# Patient Record
Sex: Female | Born: 1960 | Race: White | Hispanic: No | Marital: Married | Smoking: Never smoker
Health system: Southern US, Community
[De-identification: ages and names within clinical notes are randomized; demographics above are authoritative.]

## PROBLEM LIST (undated history)

## (undated) DIAGNOSIS — Z79890 Hormone replacement therapy (postmenopausal): Secondary | ICD-10-CM

## (undated) HISTORY — PX: DILATION AND CURETTAGE OF UTERUS: SHX78

## (undated) HISTORY — DX: Hormone replacement therapy: Z79.890

---

## 1984-07-26 HISTORY — PX: AUGMENTATION MAMMAPLASTY: SUR837

## 1998-10-31 ENCOUNTER — Other Ambulatory Visit: Admission: RE | Admit: 1998-10-31 | Discharge: 1998-10-31 | Payer: Self-pay | Admitting: Obstetrics and Gynecology

## 2002-11-22 ENCOUNTER — Other Ambulatory Visit: Admission: RE | Admit: 2002-11-22 | Discharge: 2002-11-22 | Payer: Self-pay | Admitting: *Deleted

## 2005-01-18 ENCOUNTER — Other Ambulatory Visit: Admission: RE | Admit: 2005-01-18 | Discharge: 2005-01-18 | Payer: Self-pay | Admitting: *Deleted

## 2005-01-19 ENCOUNTER — Encounter: Admission: RE | Admit: 2005-01-19 | Discharge: 2005-01-19 | Payer: Self-pay | Admitting: *Deleted

## 2006-01-20 ENCOUNTER — Other Ambulatory Visit: Admission: RE | Admit: 2006-01-20 | Discharge: 2006-01-20 | Payer: Self-pay | Admitting: Obstetrics & Gynecology

## 2008-08-14 ENCOUNTER — Other Ambulatory Visit: Admission: RE | Admit: 2008-08-14 | Discharge: 2008-08-14 | Payer: Self-pay | Admitting: Obstetrics & Gynecology

## 2009-08-20 ENCOUNTER — Encounter: Admission: RE | Admit: 2009-08-20 | Discharge: 2009-08-20 | Payer: Self-pay | Admitting: Obstetrics & Gynecology

## 2012-11-08 ENCOUNTER — Encounter: Payer: Self-pay | Admitting: *Deleted

## 2012-11-09 ENCOUNTER — Ambulatory Visit (INDEPENDENT_AMBULATORY_CARE_PROVIDER_SITE_OTHER): Payer: BC Managed Care – PPO | Admitting: Obstetrics & Gynecology

## 2012-11-09 ENCOUNTER — Encounter: Payer: Self-pay | Admitting: Obstetrics & Gynecology

## 2012-11-09 VITALS — BP 114/60 | HR 66 | Ht 68.0 in | Wt 129.0 lb

## 2012-11-09 DIAGNOSIS — Z01419 Encounter for gynecological examination (general) (routine) without abnormal findings: Secondary | ICD-10-CM

## 2012-11-09 MED ORDER — MEDROXYPROGESTERONE ACETATE 5 MG PO TABS: 5.0000 mg | ORAL_TABLET | Freq: Every day | ORAL | Status: DC

## 2012-11-09 MED ORDER — ESTRADIOL 0.075 MG/24HR TD PTTW: 1.0000 | MEDICATED_PATCH | TRANSDERMAL | Status: DC

## 2012-11-09 NOTE — Progress Notes (Signed)
52 y.o. [id number] MarriedCaucasianF here for annual exam.  Since going on HRT, is having a monthly cycle.  Some months she can bleed for up to 7 days.  Uses panty liner.  Does have some cramping before cycle.  Also notes breast tenderness for four to five days right before starting cycle.  No hot flashes or night sweats.  Sleep is good.   No LMP recorded. Patient is not currently having periods (Reason: Perimenopausal).           Sexually active: yes   The current method of family planning is post menopausal status.    Exercising: walking, running and yoga 5x weekly Smoker:  No  Health Maintenance: Pap:  10/20/2011  Negative with HR HPV  MMG: 08/20/2009  Neg Colonoscopy:  Will do this year  BMD:   never  TDaP:  2009  Labs: 2012  History reviewed. No pertinent past medical history.  Past Surgical History  Procedure Laterality Date  . Dilation and curettage of uterus    . Augmentation mammaplasty Bilateral 1986    Current Outpatient Prescriptions  Medication Sig Dispense Refill  . Calcium Carbonate-Vitamin D (CALCIUM 600 + D PO) Take by mouth 2 (two) times daily.      . Fish Oil-Cholecalciferol (FISH OIL + D3) 1200-1000 MG-UNIT CAPS Take by mouth daily.      . Multiple Vitamins-Minerals (MULTIVITAMIN PO) Take by mouth daily.       No current facility-administered medications for this visit.   No family history on file.  ROS:  Pertinent items are noted in HPI.  Otherwise, a comprehensive ROS was negative.  Exam:   BP 114/60  Pulse 66  Ht 5\' 8"  (1.727 m)  Wt 129 lb (58.514 kg)  BMI 19.62 kg/m2  Height:   Height: 5\' 8"  (172.7 cm)  Ht Readings from Last 3 Encounters:  11/09/12 5\' 8"  (1.727 m)    General appearance: alert, cooperative and appears stated age Head: Normocephalic, without obvious abnormality, atraumatic Neck: no adenopathy, supple, symmetrical, trachea midline and thyroid normal to inspection and palpation Lungs: clear to auscultation bilaterally Breasts: normal  appearance, no masses or tenderness, bilateral implants present Heart: regular rate and rhythm Abdomen: soft, non-tender; bowel sounds normal; no masses,  no organomegaly Extremities: extremities normal, atraumatic, no cyanosis or edema Skin: Skin color, texture, turgor normal. No rashes or lesions Lymph nodes: Cervical, supraclavicular, and axillary nodes normal. No abnormal inguinal nodes palpated Neurologic: Grossly normal   Pelvic: External genitalia:  no lesions              Urethra:  normal appearing urethra with no masses, tenderness or lesions              Bartholins and Skenes: normal                 Vagina: normal appearing vagina with normal color and discharge, no lesions              Cervix: no lesions              Pap taken: no Bimanual Exam:  Uterus:  normal size, contour, position, consistency, mobility, non-tender              Adnexa: no mass, fullness, tenderness               Rectovaginal: Confirms               Anus:  normal sphincter tone, no lesions  A:  Well Woman with normal exam PMP on HRT  P:   Mammogram.  Pt will do this year.   pap smear last year with neg HR HPV Labs 2012 Continue Minivelle 0.075mg  twice weekly.  Will change Provera to 5mg  qd for 15 days each month.  She will call and give me an update on symptoms. return annually or prn  An After Visit Summary was printed and given to the patient.

## 2012-11-09 NOTE — Patient Instructions (Signed)

## 2012-11-29 ENCOUNTER — Encounter: Payer: Self-pay | Admitting: Obstetrics & Gynecology

## 2013-01-19 ENCOUNTER — Telehealth: Payer: Self-pay | Admitting: Obstetrics & Gynecology

## 2013-01-19 NOTE — Telephone Encounter (Signed)
S/w pt she says she does not have any rf's on her minivelle .75mg , I called [facility] pharmacy. They have no rf's remaining I called in rf's x 1 year. Vivelle Dot .75mg  was sent through 11/09/12 instead of minivelle, Pt is aware.

## 2013-01-19 NOTE — Telephone Encounter (Signed)
Message copied by Elisha Headland on Fri Jan 19, 2013  4:56 PM ------      Message from: Jerene Bears      Created: Thu Jan 18, 2013  9:26 AM      Regarding: needs MMG       Tresa Endo,      Can you please call patient and make a chart note reminding her to get MMG.  Thanks. ------

## 2013-01-19 NOTE — Telephone Encounter (Signed)
Patient's Rx for Mini Velle has not been called in.  [facility]  [address]    needs by Monday

## 2013-01-19 NOTE — Telephone Encounter (Signed)
6/27 [email or web address]//kn 

## 2013-01-23 ENCOUNTER — Telehealth: Payer: Self-pay

## 2013-01-23 NOTE — Telephone Encounter (Signed)
Message copied by Elisha Headland on Tue Jan 23, 2013  4:26 PM ------      Message from: Jerene Bears      Created: Thu Jan 18, 2013  9:26 AM      Regarding: needs MMG       Tresa Endo,      Can you please call patient and make a chart note reminding her to get MMG.  Thanks. ------

## 2013-01-23 NOTE — Telephone Encounter (Signed)
7/1 lmtcb//kn

## 2013-01-24 ENCOUNTER — Other Ambulatory Visit: Payer: Self-pay

## 2013-01-24 DIAGNOSIS — Z1231 Encounter for screening mammogram for malignant neoplasm of breast: Secondary | ICD-10-CM

## 2013-01-24 DIAGNOSIS — Z9882 Breast implant status: Secondary | ICD-10-CM

## 2013-01-29 NOTE — Telephone Encounter (Signed)
Spoke with patient. She is aware that we are waiting for her MMG report. Patient will call this week to get that scheduled.

## 2013-02-13 ENCOUNTER — Ambulatory Visit
Admission: RE | Admit: 2013-02-13 | Discharge: 2013-02-13 | Disposition: A | Discharge status: Home / Self Care | Payer: BC Managed Care – PPO | Source: Ambulatory Visit

## 2013-02-13 DIAGNOSIS — Z9882 Breast implant status: Secondary | ICD-10-CM

## 2013-02-13 DIAGNOSIS — Z1231 Encounter for screening mammogram for malignant neoplasm of breast: Secondary | ICD-10-CM

## 2013-05-31 ENCOUNTER — Other Ambulatory Visit: Payer: Self-pay

## 2013-11-20 ENCOUNTER — Encounter: Payer: Self-pay | Admitting: Obstetrics & Gynecology

## 2013-11-20 ENCOUNTER — Ambulatory Visit (INDEPENDENT_AMBULATORY_CARE_PROVIDER_SITE_OTHER): Payer: BC Managed Care – PPO | Admitting: Obstetrics & Gynecology

## 2013-11-20 VITALS — BP 104/64 | HR 68 | Resp 16 | Ht 67.0 in | Wt 120.2 lb

## 2013-11-20 DIAGNOSIS — Z01419 Encounter for gynecological examination (general) (routine) without abnormal findings: Secondary | ICD-10-CM

## 2013-11-20 DIAGNOSIS — Z124 Encounter for screening for malignant neoplasm of cervix: Secondary | ICD-10-CM

## 2013-11-20 MED ORDER — ESTRADIOL 0.075 MG/24HR TD PTTW: 1.0000 | MEDICATED_PATCH | TRANSDERMAL | Status: DC

## 2013-11-20 MED ORDER — MEDROXYPROGESTERONE ACETATE 5 MG PO TABS: 5.0000 mg | ORAL_TABLET | Freq: Every day | ORAL | Status: DC

## 2013-11-20 NOTE — Progress Notes (Signed)
53 y.o. [id number] MarriedCaucasianF here for annual exam.  Has had blood work.  Cholesterol is good.  Using Vivelle dot.  About 3-5 days into the progesterone she starts spotting.  Spots until it is done.  Flow in March month was quite heavy but then April's bleeding was very light and short.     Patient's last menstrual period was 11/23/2005.          Sexually active: yes  The current method of family planning is post menopausal status.    Exercising: yes  running, yoga, weights Smoker:  no  Health Maintenance: Pap:  10/20/11 WNL/negative HR HPV History of abnormal Pap:  no MMG:  02/13/13 3D-normal Colonoscopy:  None.  Knows is due.  Reports she will schedule.  BMD:   none TDaP:  2009 Screening Labs: PCP, Hb today: PCP, Urine today: PCP   reports that she has never smoked. She has never used smokeless tobacco. She reports that she drinks about 1.5 - 2 ounces of alcohol per week. She reports that she does not use illicit drugs.  History reviewed. No pertinent past medical history.  Past Surgical History  Procedure Laterality Date  . Dilation and curettage of uterus    . Augmentation mammaplasty Bilateral 1986    Current Outpatient Prescriptions  Medication Sig Dispense Refill  . calcium carbonate (TUMS EX) 750 MG chewable tablet Chew 1 tablet by mouth daily.      Marland Kitchen. estradiol (VIVELLE-DOT) 0.075 MG/24HR Place 1 patch onto the skin 2 (two) times a week.  24 patch  4  . medroxyPROGESTERone (PROVERA) 5 MG tablet Take 1 tablet (5 mg total) by mouth daily.  90 tablet  4  . Fish Oil-Cholecalciferol (FISH OIL + D3) 1200-1000 MG-UNIT CAPS Take by mouth daily.       No current facility-administered medications for this visit.    Family History  Problem Relation Age of Onset  . Colon cancer Maternal Uncle 70  . Colon cancer Paternal Grandfather 3970  . Lymphoma Mother 8586    ROS:  Pertinent items are noted in HPI.  Otherwise, a comprehensive ROS was negative.  Exam:   BP 104/64  Pulse  68  Resp 16  Ht 5\' 7"  (1.702 m)  Wt 120 lb 3.2 oz (54.522 kg)  BMI 18.82 kg/m2  LMP 11/23/2005  Weight change: -9#  Height: 5\' 7"  (170.2 cm)  Ht Readings from Last 3 Encounters:  11/20/13 5\' 7"  (1.702 m)  11/09/12 5\' 8"  (1.727 m)    General appearance: alert, cooperative and appears stated age Head: Normocephalic, without obvious abnormality, atraumatic Neck: no adenopathy, supple, symmetrical, trachea midline and thyroid normal to inspection and palpation Lungs: clear to auscultation bilaterally Breasts: normal appearance, no masses or tenderness Heart: regular rate and rhythm Abdomen: soft, non-tender; bowel sounds normal; no masses,  no organomegaly Extremities: extremities normal, atraumatic, no cyanosis or edema Skin: Skin color, texture, turgor normal. No rashes or lesions Lymph nodes: Cervical, supraclavicular, and axillary nodes normal. No abnormal inguinal nodes palpated Neurologic: Grossly normal   Pelvic: External genitalia:  no lesions              Urethra:  normal appearing urethra with no masses, tenderness or lesions              Bartholins and Skenes: normal                 Vagina: normal appearing vagina with normal color and discharge, no lesions  Cervix: no lesions              Pap taken: yes Bimanual Exam:  Uterus:  normal size, contour, position, consistency, mobility, non-tender              Adnexa: normal adnexa and no mass, fullness, tenderness               Rectovaginal: Confirms               Anus:  normal sphincter tone, no lesions  A:  Well Woman with normal exam On HRT Cycling on HRT.  Cycle in march was heavy.  Pt aware she knows I want her to call if has another cycle like March.  P:   Mammogram yearly.  D/W pt 3D due to breast density. pap smear neg with neg HR HPV 3/13.   Labs with husband. Vivelle dot 0.075mg  twice weekly.  Provera 5mg  daily 1-15 weekly. return annually or prn  An After Visit Summary was printed and given to  the patient.

## 2013-11-29 ENCOUNTER — Other Ambulatory Visit: Payer: Self-pay | Admitting: Obstetrics & Gynecology

## 2013-11-29 NOTE — Telephone Encounter (Signed)
[facility]OVERA Last filled - 11/20/13 X 1 YEAR Last AEX - 11/20/13 RX denied as this was given x 1 year at AEX.

## 2014-05-27 ENCOUNTER — Encounter: Payer: Self-pay | Admitting: Obstetrics & Gynecology

## 2014-06-07 ENCOUNTER — Telehealth: Payer: Self-pay | Admitting: Obstetrics & Gynecology

## 2014-06-07 DIAGNOSIS — T8543XA Leakage of breast prosthesis and implant, initial encounter: Secondary | ICD-10-CM

## 2014-06-07 NOTE — Telephone Encounter (Signed)
After review of ROI, left detailed message with pt regarding MMG done 05/29/14 showing a possible small silicone leak from breast implant vs herniation of implant.  Recommended follow up with breast surgeon.  Asked pt to call back so I know she received message and to see if she had any other questions.    Will send this message to Triage to be aware pt may call.

## 2014-06-10 NOTE — Telephone Encounter (Signed)
Patient returning call to Dr Hyacinth MeekerMiller. She states [place] and she will need referral to new surgeon. She had the type of surgery where implant was placed behind pectoral muscle so she would like someone that does this type of work. Has multiple questions about what type of problem this could be and the necessity of surgical correction. Advised that most of these answers will need to come from breast surgeon directly.  Will check with Dr Hyacinth MeekerMiller on who to refer patient to and call her back.

## 2014-06-11 NOTE — Telephone Encounter (Signed)
Spoke with pt personally.  Questions answered.  Will refer pt to Dr. Odis LusterBowers for consultation.  Pt can go anytime for referral.  Order placed.   Martie LeeSabrina, please confirm with Dr. Odis LusterBowers office if pt needs to go and get a copy of her films from [facility] to take to appt.  Also, please send me back a message so I know when appt will be.  Thanks.  CC:  Billie RuddySally Yeakley

## 2014-06-12 NOTE — Telephone Encounter (Signed)
Left message for Office Manager, Thornton Parkngie Collins, to call me back. She schedules all new patient referrals.

## 2014-06-12 NOTE — Telephone Encounter (Signed)
Spoke with Angie from Dr Odis LusterBowers office. Patient is scheduled for consult 12.04 @ 0945. Patient is to bring written report only from [facility] and $150 consultation fee. ** Call to patient. Advised of appt with Dr Odis LusterBowers office, the fact that she should bring written report from [facility], and $150 consultation fee. Patient agreeable.

## 2014-06-14 ENCOUNTER — Telehealth: Payer: Self-pay | Admitting: Obstetrics & Gynecology

## 2014-06-14 NOTE — Telephone Encounter (Signed)
Spoke with patient. Patient states that she spoke with Martie LeeSabrina in our office regarding appointment with Dr.Bowers. "She told me that I needed to bring a report from [facility] to my appointment. My husband thinks that I need to bring the films to the appointment. I was just calling to get clarification on what I need to bring." Advised that I called over to Dr.Bowers office but their office is closed for the day. Will call Monday morning for clarification and return call to patient. Patient is agreeable.

## 2014-06-14 NOTE — Telephone Encounter (Signed)
Pt is calling because she is confused about what she should be taking to her referral appointment.

## 2014-06-18 NOTE — Telephone Encounter (Signed)
Spoke with Dr.Bowers office who states that mammogram report will be needed for office visit on 12/4/20115 with Dr.Bowers. Faxed mammogram report to Dr.Bowers office at [phone] with cover sheet and fax confirmation. Spoke with patient [facility] and that I have faxed report. Patient is agreeable and verbalizes understanding.  Routing to provider for final review. Patient agreeable to disposition. Will close encounter

## 2014-07-09 HISTORY — PX: BREAST SURGERY: SHX581

## 2014-10-31 IMAGING — MG MM DIGITAL SCREENING IMPLANTS W/ CAD
12 series · 12 of 12 positions shown · non-contrast
Comparison: 08/20/2009

CLINICAL DATA: Screening.

[L CC (1 of 2)]
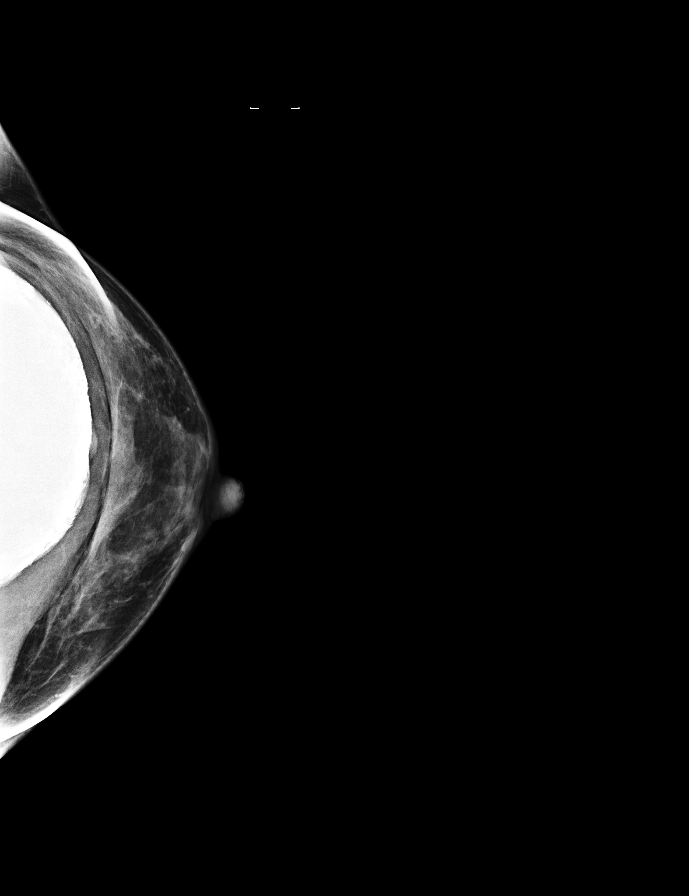

[L MLO (1 of 2)]
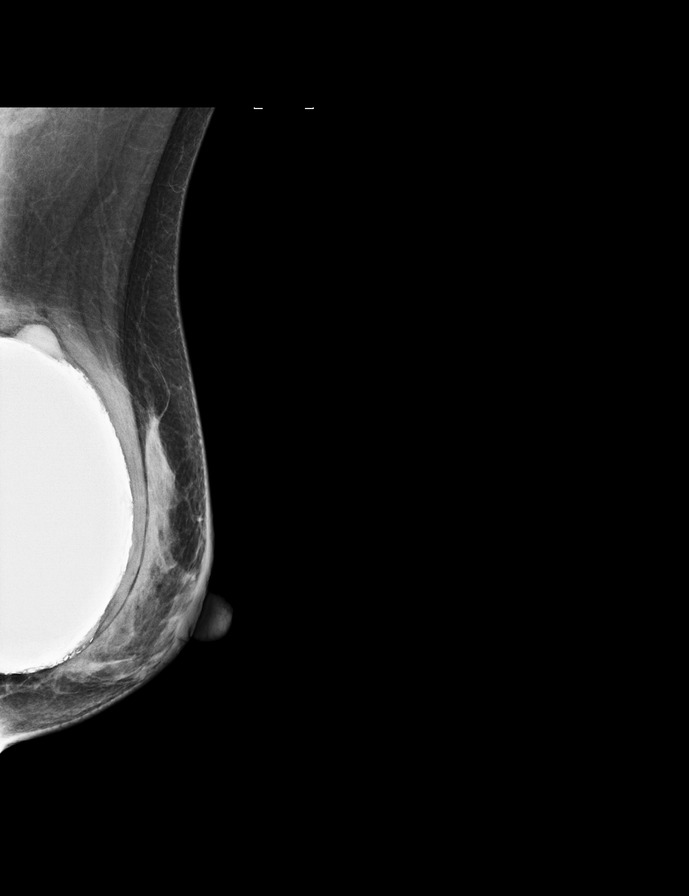

[R MLO (1 of 2)]
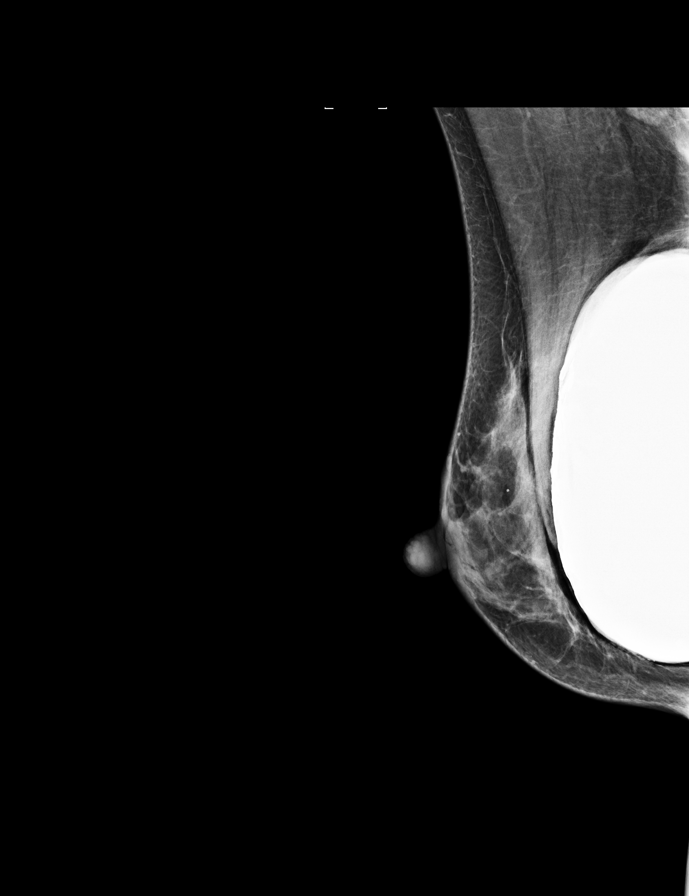

[R CC (1 of 2)]
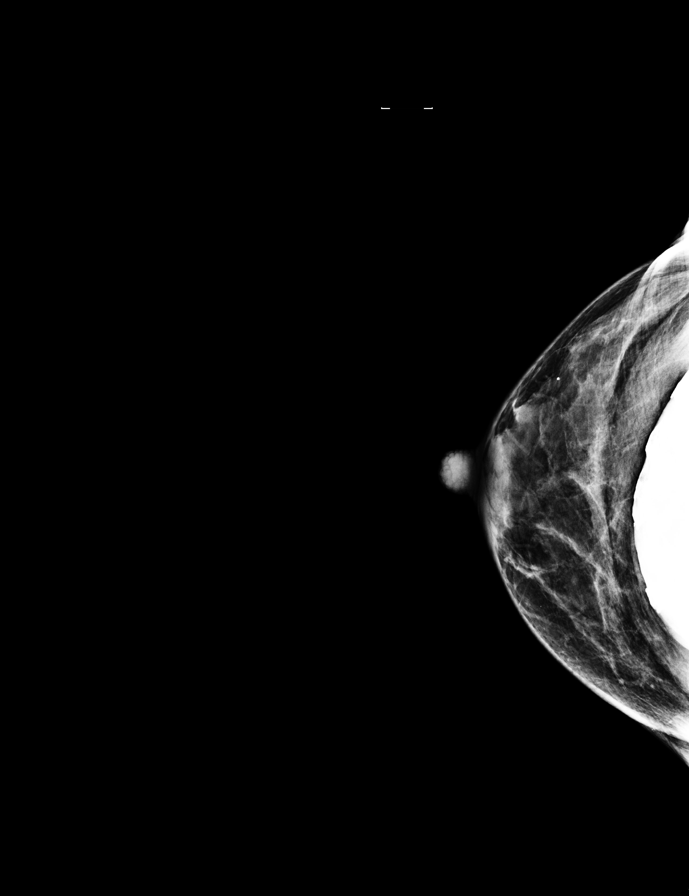

[R CC (2 of 2)]
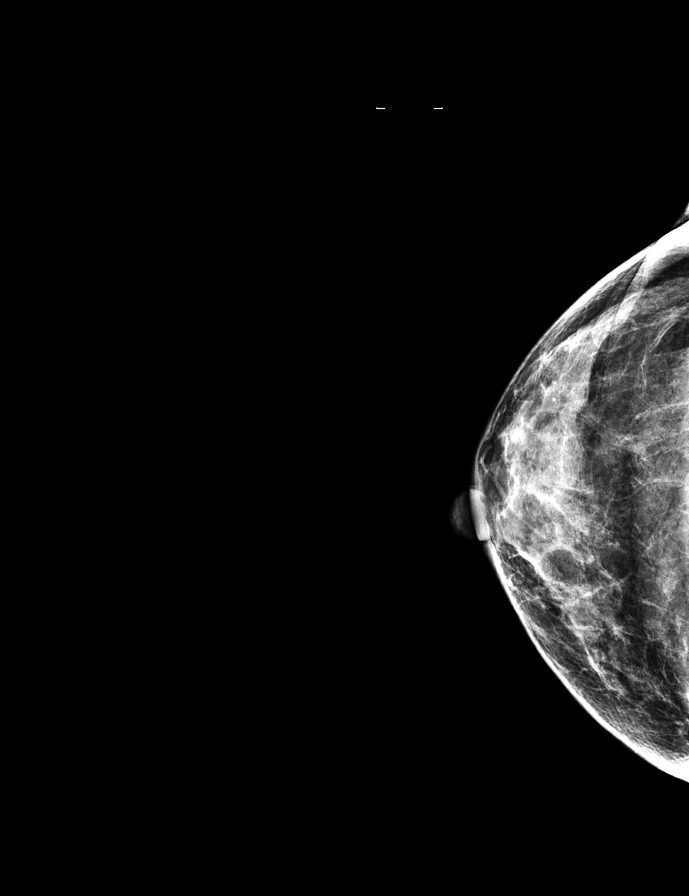

[L CC (2 of 2)]
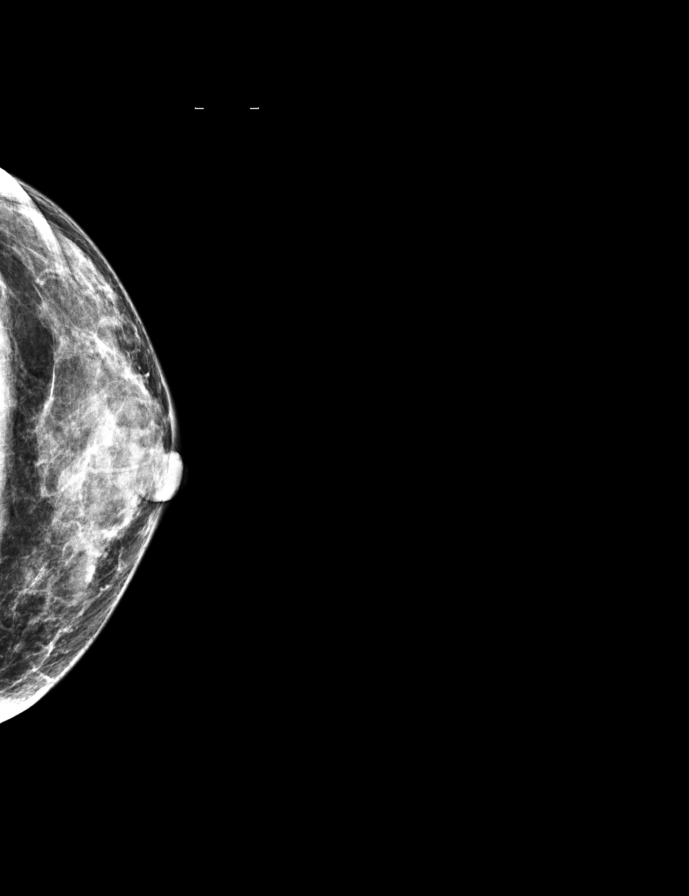

[R MLO (2 of 2)]
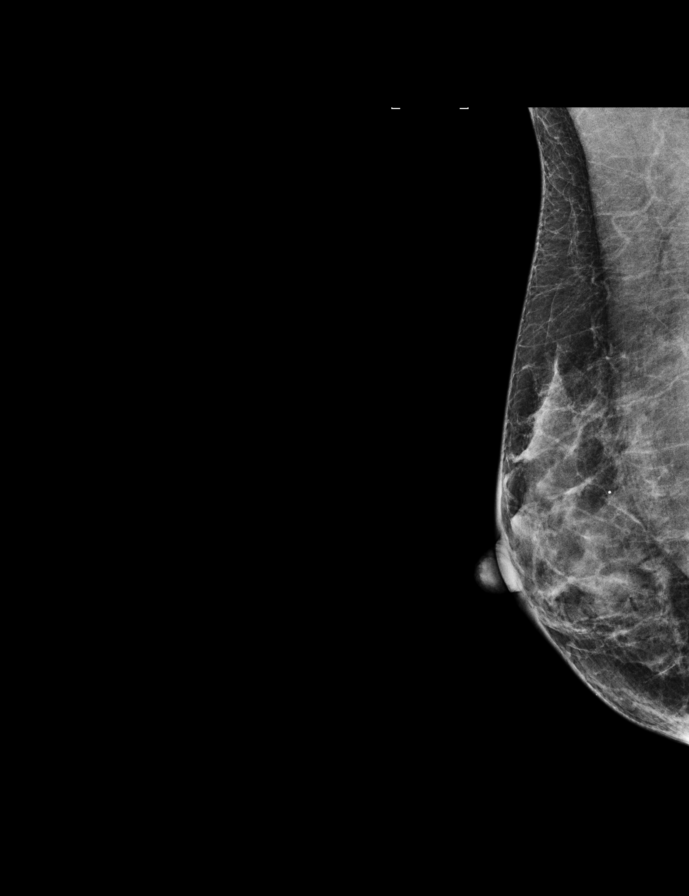

[L MLO (2 of 2)]
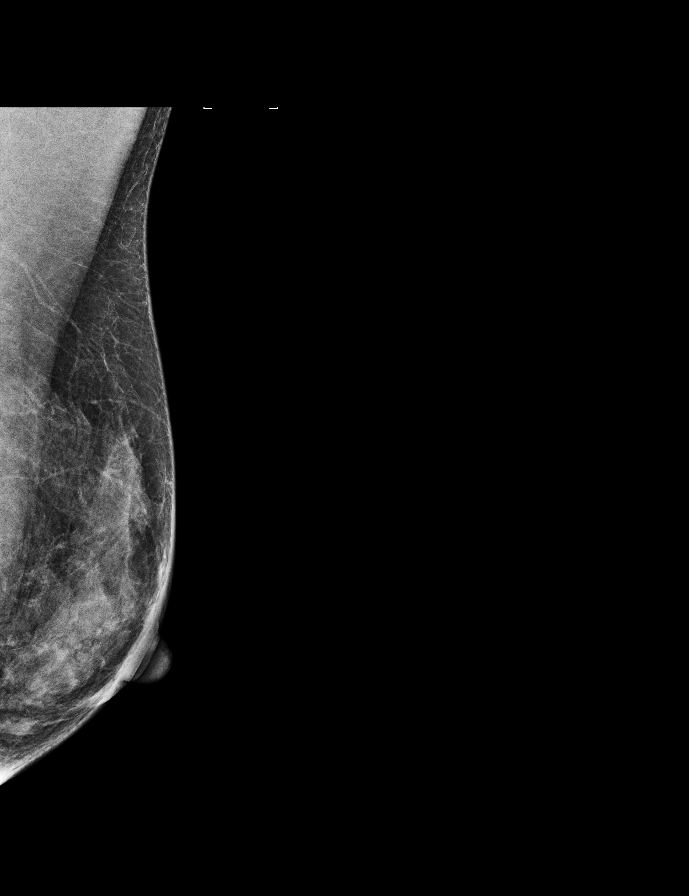

[L CC tomo]
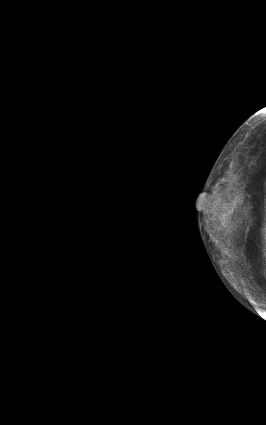

[L MLO tomo]
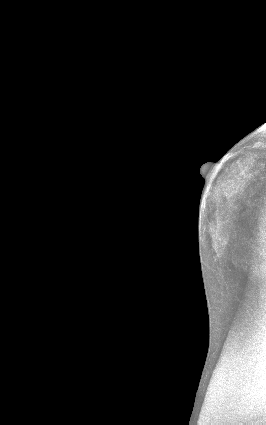

[R CC tomo]
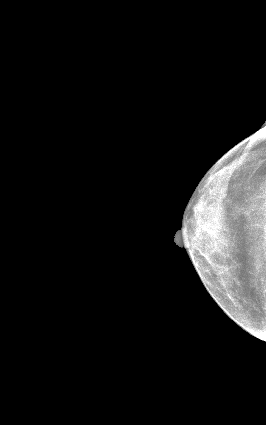

[R MLO tomo]
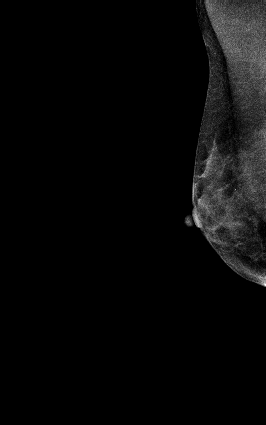

[12 of 12 positions shown; findings below may reference images not displayed]

DIGITAL SCREENING BILATERAL MAMMOGRAM WITH IMPLANTS AND CAD
DIGITAL BREAST TOMOSYNTHESIS

Digital breast tomosynthesis images are acquired in two
projections.  These images are reviewed in combination with the
digital mammogram, confirming the findings below.
The patient has subpectoral silicone implants. Standard and implant
displaced views were performed.
FINDINGS: ACR Breast Density Category c:  The breast tissue is
heterogeneously dense, which may obscure small masses.

There is no suspicious dominant mass, architectural distortion, or
calcification to suggest malignancy.

Images were processed with CAD.
IMPRESSION: No mammographic evidence of malignancy.

A result letter of this screening mammogram will be mailed directly
to the patient.

RECOMMENDATION:
Screening mammogram in one year. (Code:DC-S-IYP)

BI-RADS CATEGORY 1:  Negative.

## 2014-11-26 ENCOUNTER — Ambulatory Visit (INDEPENDENT_AMBULATORY_CARE_PROVIDER_SITE_OTHER): Payer: BLUE CROSS/BLUE SHIELD | Admitting: Obstetrics & Gynecology

## 2014-11-26 ENCOUNTER — Encounter: Payer: Self-pay | Admitting: Obstetrics & Gynecology

## 2014-11-26 VITALS — BP 101/62 | HR 60 | Resp 16 | Ht 67.5 in | Wt 123.2 lb

## 2014-11-26 DIAGNOSIS — Z124 Encounter for screening for malignant neoplasm of cervix: Secondary | ICD-10-CM

## 2014-11-26 DIAGNOSIS — Z1211 Encounter for screening for malignant neoplasm of colon: Secondary | ICD-10-CM

## 2014-11-26 DIAGNOSIS — Z01419 Encounter for gynecological examination (general) (routine) without abnormal findings: Secondary | ICD-10-CM | POA: Diagnosis not present

## 2014-11-26 MED ORDER — ESTRADIOL 0.075 MG/24HR TD PTTW: 1.0000 | MEDICATED_PATCH | TRANSDERMAL | Status: DC

## 2014-11-26 MED ORDER — MEDROXYPROGESTERONE ACETATE 5 MG PO TABS: 5.0000 mg | ORAL_TABLET | Freq: Every day | ORAL | Status: DC

## 2014-11-26 NOTE — Progress Notes (Signed)
54 y.o. [id number] MarriedCaucasianF here for annual exam.  Doing well.  Spotting every month.  Spotting lasts about 5 days.  Minimal cramping.  Had a small left implant rupture.  Saw Dr. Odis LusterBowers and had implant removed and replaced with new silicone implants.  Labs done in husband's office.  Reports lab work was normal.      No LMP recorded. Patient is not currently having periods (Reason: Perimenopausal).          Sexually active: Yes.    The current method of family planning is none.    Exercising: Yes.    yoga, run, and weights Smoker:  no  Health Maintenance: Pap:  10/20/11 WNL/negative HR HPV History of abnormal Pap:  no MMG:  05/29/14-ruptured implant, normal MMG-had implants removed and replaced on 07/09/14 Colonoscopy:  none BMD:   none TDaP:  2009 Screening Labs: PCP, Hb today: PCP, Urine today: PCP   reports that she has never smoked. She has never used smokeless tobacco. She reports that she drinks about 1.8 oz of alcohol per week. She reports that she does not use illicit drugs.  No past medical history on file.  Past Surgical History  Procedure Laterality Date  . Dilation and curettage of uterus    . Augmentation mammaplasty Bilateral 1986  . Breast surgery  07/09/14    removal and replaced implants    Current Outpatient Prescriptions  Medication Sig Dispense Refill  . Calcium Carbonate-Vitamin D (CALCIUM + D PO) Take by mouth daily.    Marland Kitchen. estradiol (VIVELLE-DOT) 0.075 MG/24HR Place 1 patch onto the skin 2 (two) times a week. 24 patch 4  . medroxyPROGESTERone (PROVERA) 5 MG tablet Take 1 tablet (5 mg total) by mouth daily. 90 tablet 4   No current facility-administered medications for this visit.    Family History  Problem Relation Age of Onset  . Colon cancer Maternal Uncle 70  . Colon cancer Paternal Grandfather 8570  . Lymphoma Mother 4786    ROS:  Pertinent items are noted in HPI.  Otherwise, a comprehensive ROS was negative.  Exam:   BP 101/62 mmHg  Pulse 60   Resp 16  Ht 5' 7.5" (1.715 m)  Wt 123 lb 3.2 oz (55.883 kg)  BMI 19.00 kg/m2  Weight change: +3#  Height: 5' 7.5" (171.5 cm)  Ht Readings from Last 3 Encounters:  11/26/14 5' 7.5" (1.715 m)  11/20/13 5\' 7"  (1.702 m)  11/09/12 5\' 8"  (1.727 m)    General appearance: alert, cooperative and appears stated age Head: Normocephalic, without obvious abnormality, atraumatic Neck: no adenopathy, supple, symmetrical, trachea midline and thyroid normal to inspection and palpation Lungs: clear to auscultation bilaterally Breasts: normal appearance, no masses or tenderness Heart: regular rate and rhythm Abdomen: soft, non-tender; bowel sounds normal; no masses,  no organomegaly Extremities: extremities normal, atraumatic, no cyanosis or edema Skin: Skin color, texture, turgor normal. No rashes or lesions Lymph nodes: Cervical, supraclavicular, and axillary nodes normal. No abnormal inguinal nodes palpated Neurologic: Grossly normal   Pelvic: External genitalia:  no lesions              Urethra:  normal appearing urethra with no masses, tenderness or lesions              Bartholins and Skenes: normal                 Vagina: normal appearing vagina with normal color and discharge, no lesions  Cervix: no lesions              Pap taken: Yes.   Bimanual Exam:  Uterus:  normal size, contour, position, consistency, mobility, non-tender              Adnexa: normal adnexa and no mass, fullness, tenderness               Rectovaginal: Confirms               Anus:  normal sphincter tone, no lesions  Chaperone was present for exam.  A:  Well Woman with normal exam On HRT with cyclic progesterone H/O implant rupture last fall with replacement of implants Maternal uncle and paternal grandfather with hx of colon cancer (both in 61's)  P: Mammogram yearly. D/W pt 3D due to breast density. Referral to Dr. Leone Payor for screening colonscopy pap smear neg with neg HR HPV 3/13.  Pap  today. Labs with husband Vivelle dot 0.075mg  twice weekly (prescribed generic this year). Provera  daily 1-15 weekly.  Rx to pharmacy. return annually or prn

## 2014-11-28 LAB — IPS PAP TEST WITH REFLEX TO HPV

## 2014-12-11 ENCOUNTER — Other Ambulatory Visit: Payer: Self-pay | Admitting: Obstetrics & Gynecology

## 2014-12-11 NOTE — Telephone Encounter (Signed)
Medication refill request: Minivelle  Last AEX:  11/26/14 SM Next AEX: 02/05/16 SM Last MMG (if hormonal medication request): 05/29/14 BIRADS1:Neg Refill authorized: 11/26/14 [facility]24[facility]/ [facility] [address]

## 2015-12-14 ENCOUNTER — Other Ambulatory Visit: Payer: Self-pay | Admitting: Obstetrics & Gynecology

## 2015-12-15 NOTE — Telephone Encounter (Signed)
Medication refill request: Estradiol (Vivelle Patch)  Last AEX:  11-26-14 Next AEX: 02-05-16  Last MMG (if hormonal medication request): 08-15-15 WNL  Refill authorized: please advise

## 2016-02-05 ENCOUNTER — Encounter: Payer: Self-pay | Admitting: Obstetrics & Gynecology

## 2016-02-05 ENCOUNTER — Ambulatory Visit (INDEPENDENT_AMBULATORY_CARE_PROVIDER_SITE_OTHER): Payer: 59 | Admitting: Obstetrics & Gynecology

## 2016-02-05 VITALS — BP 110/60 | HR 60 | Resp 14 | Ht 67.0 in | Wt 122.0 lb

## 2016-02-05 DIAGNOSIS — Z01419 Encounter for gynecological examination (general) (routine) without abnormal findings: Secondary | ICD-10-CM | POA: Diagnosis not present

## 2016-02-05 DIAGNOSIS — Z79890 Hormone replacement therapy (postmenopausal): Secondary | ICD-10-CM

## 2016-02-05 DIAGNOSIS — Z1211 Encounter for screening for malignant neoplasm of colon: Secondary | ICD-10-CM | POA: Diagnosis not present

## 2016-02-05 MED ORDER — ESTRADIOL-NORETHINDRONE ACET 0.05-0.25 MG/DAY TD PTTW: 1.0000 | MEDICATED_PATCH | TRANSDERMAL | Status: DC

## 2016-02-05 NOTE — Progress Notes (Signed)
55 y.o. [id number] MarriedCaucasianF here for annual exam.  Spots monthly for 5 days.  This is very light and is spotting only.  Has had some swelling with high dosages of progesterone.   No LMP recorded. Patient is not currently having periods (Reason: Perimenopausal).          Sexually active: Yes.    The current method of family planning is postmenopausal status.    Exercising: Yes.    Gym/running/walking/yoga Smoker:  no  Health Maintenance: Pap:  11/26/14 WNL, neg HR HPV 2013 History of abnormal Pap:  no MMG:  08/15/15 BIRADS1 negative Colonoscopy:  never BMD:   n/a TDaP:  2009 Hep C testing: D/W pt CDC guidelines today.  Has given blood.   Screening Labs: PCP, Hb today: PCP, Urine today: PCP   reports that she has never smoked. She has never used smokeless tobacco. She reports that she drinks about 1.8 oz of alcohol per week. She reports that she does not use illicit drugs.  No past medical history on file.  Past Surgical History  Procedure Laterality Date  . Dilation and curettage of uterus    . Augmentation mammaplasty Bilateral 1986  . Breast surgery  07/09/14    removal and replaced implants    Current Outpatient Prescriptions  Medication Sig Dispense Refill  . Calcium Carbonate-Vitamin D (CALCIUM + D PO) Take by mouth daily.    Marland Kitchen. estradiol (VIVELLE-DOT) 0.075 MG/24HR APPLY 1 PATCH ONTO THE SKIN TWICE WEEKLY 24 patch 0  . medroxyPROGESTERone (PROVERA) 5 MG tablet Take 1 tablet (5 mg total) by mouth daily. 90 tablet 4   No current facility-administered medications for this visit.    Family History  Problem Relation Age of Onset  . Colon cancer Maternal Uncle 70  . Colon cancer Paternal Grandfather 2270  . Lymphoma Mother 6286    ROS:  Pertinent items are noted in HPI.  Otherwise, a comprehensive ROS was negative.  Exam:   BP 110/60 mmHg  Pulse 60  Resp 14  Ht 5\' 7"  (1.702 m)  Wt 122 lb (55.339 kg)  BMI 19.10 kg/m2  Weight change: -1#  Height: 5\' 7"  (170.2 cm)  Ht  Readings from Last 3 Encounters:  02/05/16 5\' 7"  (1.702 m)  11/26/14 5' 7.5" (1.715 m)  11/20/13 5\' 7"  (1.702 m)    General appearance: alert, cooperative and appears stated age Head: Normocephalic, without obvious abnormality, atraumatic Neck: no adenopathy, supple, symmetrical, trachea midline and thyroid normal to inspection and palpation Lungs: clear to auscultation bilaterally Breasts: normal appearance, no masses or tenderness, bilateral implants present Heart: regular rate and rhythm Abdomen: soft, non-tender; bowel sounds normal; no masses,  no organomegaly Extremities: extremities normal, atraumatic, no cyanosis or edema Skin: Skin color, texture, turgor normal. No rashes or lesions Lymph nodes: Cervical, supraclavicular, and axillary nodes normal. No abnormal inguinal nodes palpated Neurologic: Grossly normal   Pelvic: External genitalia:  no lesions              Urethra:  normal appearing urethra with no masses, tenderness or lesions              Bartholins and Skenes: normal                 Vagina: normal appearing vagina with normal color and discharge, no lesions              Cervix: no lesions  Pap taken: No. Bimanual Exam:  Uterus:  normal size, contour, position, consistency, mobility, non-tender              Adnexa: normal adnexa and no mass, fullness, tenderness               Rectovaginal: Confirms               Anus:  normal sphincter tone, no lesions  Chaperone was present for exam.   A:Well Woman with normal exam On HRT with cyclic progesterone  H/O implant rupture with replacement of implants 2015 Maternal uncle and paternal grandfather with hx of colon cancer (both in 19's)  P: Mammogram yearly. D/W pt 3D due to breast density. Colonoscopy referral was done last year.  Pt didn't go.  Requests referral for colonoscopy again this year.   Pap smear neg with neg HR HPV 3/13. Pap 2016.  No pap today. Change to Combipatch 50/250  twice weekly.  #90 day supply/4RF.   She does lab work with her husband's office return annually or prn

## 2016-03-06 ENCOUNTER — Other Ambulatory Visit: Payer: Self-pay | Admitting: Obstetrics & Gynecology

## 2016-03-08 NOTE — Telephone Encounter (Signed)
Medication refill request: Estradiol patch 0.075 MG/24HR Last AEX:  02/05/16 SM Next AEX: 05/06/17  Last MMG (if hormonal medication request): 08/15/15 BIRADS1 negative  Refill authorized: 12/15/15 #24 w/0; today please advise

## 2016-05-26 ENCOUNTER — Other Ambulatory Visit: Payer: Self-pay | Admitting: Obstetrics & Gynecology

## 2016-05-26 NOTE — Telephone Encounter (Signed)
Medication refill request: PROVERA 5mg  Last AEX:  02/05/16 SM Next AEX: 05/06/17 Last MMG (if hormonal medication request): 08/15/15 BIRADS1 negative Refill authorized: 11/26/14 #90 w/4 refills; today please advise

## 2016-11-15 ENCOUNTER — Other Ambulatory Visit: Payer: Self-pay | Admitting: Obstetrics & Gynecology

## 2016-11-15 NOTE — Telephone Encounter (Signed)
Medication refill request: Medroxyprogesterone Last AEX:  02/05/16 SM Next AEX: 05/06/17 SM Last MMG (if hormonal medication request): 08/15/15 BIRADS1, Density C, [facility] Refill authorized: 05/27/16 #90 0R. Please advise. Thank you.

## 2016-11-17 ENCOUNTER — Telehealth: Payer: Self-pay | Admitting: Obstetrics & Gynecology

## 2016-11-17 NOTE — Telephone Encounter (Signed)
Spoke with patient. Patient states on 11/10/2016 she began having right breast discomfort. Feels a lump in the right breast that she feels may be connective tissue. Reports discomfort is still present. Denies swelling or redness. Appointment for evaluation scheduled for 11/18/2016 at 11:15 am with Dr.Miller. Patient is agreeable to date and time.  Routing to provider for final review. Patient agreeable to disposition. Will close encounter.

## 2016-11-17 NOTE — Telephone Encounter (Signed)
Jessica calling from [place]. Patient at [facility] to get screening MMG and patient advised staff she felt lump in breast. Shanda Bumps calling for order for diagnostic MMG or will patient need to be evaluated? Placed Shanda Bumps on brief hold, reviewed with covering provider, Dr. Edward Jolly. Per office protocol, patient needs to be seen in office for evaluation. Spoke with Shanda Bumps, advised patient will need to be seen in office for further evaluation. Shanda Bumps to advise patient.  Routing to provider for final review. Will close encounter.   Cc: Dr. Hyacinth Meeker

## 2016-11-17 NOTE — Telephone Encounter (Signed)
Patient has a lump in her right breast.

## 2016-11-18 ENCOUNTER — Ambulatory Visit (INDEPENDENT_AMBULATORY_CARE_PROVIDER_SITE_OTHER): Payer: 59 | Admitting: Obstetrics & Gynecology

## 2016-11-18 VITALS — BP 100/68 | HR 84 | Resp 16 | Ht 67.0 in | Wt 122.0 lb

## 2016-11-18 DIAGNOSIS — N644 Mastodynia: Secondary | ICD-10-CM

## 2016-11-18 NOTE — Progress Notes (Signed)
GYNECOLOGY  VISIT   HPI: 56 y.o. G62P0011 Married Caucasian female here for one week history of right breast tenderness and "mass" that she can feel.  Denies trauma.  Denies bruising.  Went to have MMG yesterday and was advised needed to be seen before diagnostic could be ordered.  She is on HRT.  Last MMG was 1/201/7.  She knows she is overdue for routine screening as well.  GYNECOLOGIC HISTORY: No LMP recorded. Patient is not currently having periods (Reason: Perimenopausal). Contraception: post menopausal  Menopausal hormone therapy: vivelle dot and provera.   Patient Active Problem List   Diagnosis Date Noted  . Postmenopausal HRT (hormone replacement therapy) 02/05/2016    No past medical history on file.  Past Surgical History:  Procedure Laterality Date  . AUGMENTATION MAMMAPLASTY Bilateral 1986  . BREAST SURGERY  07/09/14   removal and replaced implants  . DILATION AND CURETTAGE OF UTERUS      MEDS:  Reviewed in EPIC and UTD  ALLERGIES: Lactose intolerance (gi)  Family History  Problem Relation Age of Onset  . Colon cancer Maternal Uncle 70  . Colon cancer Paternal Grandfather 45  . Lymphoma Mother 59    SH:  Married, non smoker  Review of Systems  All other systems reviewed and are negative.   PHYSICAL EXAMINATION:    BP 100/68 (BP Location: Right Arm, Patient Position: Sitting, Cuff Size: Normal)   Pulse 84   Resp 16   Ht  (1.702 m)   Wt 122 lb (55.3 kg)   BMI 19.11 kg/m     Physical Exam  Constitutional: She is oriented to person, place, and time. She appears well-developed and well-nourished.  Respiratory: Right breast exhibits tenderness. Right breast exhibits no inverted nipple, no mass, no nipple discharge and no skin change. Left breast exhibits no inverted nipple, no mass, no nipple discharge, no skin change and no tenderness. Breasts are symmetrical.    Neurological: She is alert and oriented to person, place, and time.  Skin: Skin is  warm and dry.  Psychiatric: She has a normal mood and affect.    Assessment: Dense connective tissue with tenderness noted  Plan: Diagnostic bilateral MMG will be scheduled to evaluate right breast tenderness and to catch patient up for screening on left as well.

## 2016-11-18 NOTE — Progress Notes (Signed)
Patient scheduled while in office. Spoke with Bonita Quin at [facility], patient scheduled for diagnostic MMG and ultrasound, if needed, for today at 2:30pm, arriving at 2:15pm. Patient verbalizes understanding and is agreeable. Written order faxed to [facility].

## 2016-11-19 ENCOUNTER — Encounter: Payer: Self-pay | Admitting: Obstetrics & Gynecology

## 2016-11-25 ENCOUNTER — Encounter: Payer: Self-pay | Admitting: Obstetrics & Gynecology

## 2016-12-28 ENCOUNTER — Ambulatory Visit: Payer: Self-pay | Admitting: Obstetrics & Gynecology

## 2017-02-04 ENCOUNTER — Other Ambulatory Visit: Payer: Self-pay | Admitting: Obstetrics & Gynecology

## 2017-02-04 NOTE — Telephone Encounter (Signed)
[facility] on VIVELLE-DOT Last filled - 03/18/16, #24 X 3 RF Last AEX - 02/05/16 Next AEX - 05/06/17 Last MMG - 11/18/16 bilateral diagnostic with ultrasound; Bi-Rads 2:  Benign, routine screening in one year. Mammogram report not in chart, [facility], she will fax report.  Please advise refills. #24 x 0

## 2017-05-06 ENCOUNTER — Other Ambulatory Visit (HOSPITAL_COMMUNITY)
Admission: RE | Admit: 2017-05-06 | Discharge: 2017-05-06 | Disposition: A | Discharge status: Home / Self Care | Payer: 59 | Source: Ambulatory Visit | Attending: Obstetrics & Gynecology | Admitting: Obstetrics & Gynecology

## 2017-05-06 ENCOUNTER — Ambulatory Visit (INDEPENDENT_AMBULATORY_CARE_PROVIDER_SITE_OTHER): Payer: 59 | Admitting: Obstetrics & Gynecology

## 2017-05-06 ENCOUNTER — Encounter: Payer: Self-pay | Admitting: Obstetrics & Gynecology

## 2017-05-06 VITALS — BP 110/76 | HR 62 | Resp 14 | Ht 67.5 in | Wt 122.0 lb

## 2017-05-06 DIAGNOSIS — Z124 Encounter for screening for malignant neoplasm of cervix: Secondary | ICD-10-CM | POA: Insufficient documentation

## 2017-05-06 DIAGNOSIS — Z01419 Encounter for gynecological examination (general) (routine) without abnormal findings: Secondary | ICD-10-CM

## 2017-05-06 NOTE — Progress Notes (Addendum)
56 y.o. [id number] MarriedCaucasianF here for annual exam.  Doing well.  Denies vaginal bleeding.  Went to [place] this summer for a trip.  Going to [place] today to check on place that they have there.    Lowered HRT dosage to 0.05mg  dosage.  Bleeding is much better.  Just has a few days of spotting each month.  LMP:  05/04/17, spots a few days each month with provera         Sexually active: Yes.    The current method of family planning is post menopausal status.    Exercising: Yes.    run, lift weights, yoga Smoker:  no  Health Maintenance: Pap:  11/26/14 Neg  History of abnormal Pap:  no MMG:  11/18/16 [place] right BIRADS2:Benign. F/u 1 year Colonoscopy:  04/2016 normal  BMD:   Never TDaP:  2009 Pneumonia vaccine(s):  No Zostavax:   No Hep C testing: donates blood  Screening Labs: PCP   reports that she has never smoked. She has never used smokeless tobacco. She reports that she drinks about 1.8 oz of alcohol per week . She reports that she does not use drugs.  History reviewed. No pertinent past medical history.  Past Surgical History:  Procedure Laterality Date  . AUGMENTATION MAMMAPLASTY Bilateral 1986  . BREAST SURGERY  07/09/14   removal and replaced implants  . DILATION AND CURETTAGE OF UTERUS      Current Outpatient Prescriptions  Medication Sig Dispense Refill  . Calcium Carbonate-Vitamin D (CALCIUM + D PO) Take by mouth daily.    Marland Kitchen estradiol (VIVELLE-DOT) 0.075 MG/24HR APPLY 1 PATCH ONTO THE SKIN TWICE WEEKLY (Patient taking differently: APPLY 1/3 PATCH ONTO THE SKIN TWICE WEEKLY) 24 patch 1  . medroxyPROGESTERone (PROVERA) 5 MG tablet TAKE 1 TABLET(5 MG) BY MOUTH DAILY (Patient taking differently: TAKE 1 TABLET(5 MG) BY MOUTH DAILY for the first 15 days) 90 tablet 1   No current facility-administered medications for this visit.     Family History  Problem Relation Age of Onset  . Colon cancer Maternal Uncle 70  . Colon cancer Paternal Grandfather 78  . Lymphoma  Mother 60    ROS:  Pertinent items are noted in HPI.  Otherwise, a comprehensive ROS was negative.  Exam:   BP 110/76 (BP Location: Right Arm, Patient Position: Sitting, Cuff Size: Normal)   Pulse 62   Resp 14   Ht 5' 7.5" (1.715 m)   Wt 122 lb (55.3 kg)   LMP 07/26/2009   BMI 18.83 kg/m   Height: 5' 7.5" (171.5 cm)  Ht Readings from Last 3 Encounters:  05/06/17 5' 7.5" (1.715 m)  11/18/16  (1.702 m)  02/05/16  (1.702 m)   General appearance: alert, cooperative and appears stated age Head: Normocephalic, without obvious abnormality, atraumatic Neck: no adenopathy, supple, symmetrical, trachea midline and thyroid normal to inspection and palpation Lungs: clear to auscultation bilaterally Breasts: normal appearance, no masses or tenderness, bilateral implants bilaterally Heart: regular rate and rhythm Abdomen: soft, non-tender; bowel sounds normal; no masses,  no organomegaly Extremities: extremities normal, atraumatic, no cyanosis or edema Skin: Skin color, texture, turgor normal. No rashes or lesions Lymph nodes: Cervical, supraclavicular, and axillary nodes normal. No abnormal inguinal nodes palpated Neurologic: Grossly normal   Pelvic: External genitalia:  no lesions              Urethra:  normal appearing urethra with no masses, tenderness or lesions  Bartholins and Skenes: normal                 Vagina: normal appearing vagina with normal color and discharge, no lesions              Cervix: no lesions              Pap taken: Yes.   Bimanual Exam:  Uterus:  normal size, contour, position, consistency, mobility, non-tender              Adnexa: normal adnexa and no mass, fullness, tenderness               Rectovaginal: Confirms               Anus:  normal sphincter tone, no lesions  Chaperone was present for exam.  A:  Well Woman with normal exam PMP, on HRT H/O breast implant rupture with replacement 2015 Family hx of colon cancer  P:    Mammogram guidelines reviewed. Doing yearly. pap smear and HR HPV obtained today Does not need RF for HRT.  Considering lower Vivelle dot dosage lower to 0.0375mg .  She will let me know in a few months what she's done or if she has any concerns during this process. Return annually or prn

## 2017-05-10 LAB — CYTOLOGY - PAP
DIAGNOSIS: NEGATIVE
HPV: NOT DETECTED

## 2017-05-22 ENCOUNTER — Other Ambulatory Visit: Payer: Self-pay | Admitting: Obstetrics & Gynecology

## 2017-05-23 NOTE — Telephone Encounter (Signed)
Medication refill request: Provera  Last AEX:  05-06-17  Next AEX: 07-27-08 Last MMG (if hormonal medication request): [id number] U/S WNL  Refill authorized: please advise

## 2017-07-19 ENCOUNTER — Other Ambulatory Visit: Payer: Self-pay | Admitting: Obstetrics & Gynecology

## 2017-07-20 NOTE — Telephone Encounter (Signed)
Medication refill request: Vivelle Patch  Last AEX:  05-06-17  Next AEX: 07-27-18 Last MMG (if hormonal medication request): U/S 11-18-16 WNL  Refill authorized: please advise

## 2018-04-24 ENCOUNTER — Other Ambulatory Visit: Payer: Self-pay | Admitting: Obstetrics & Gynecology

## 2018-04-24 NOTE — Telephone Encounter (Signed)
Medication refill request: vivelle dot 0.075 Last AEX:  05/06/17 Next AEX: 08/14/18 Last MMG (if hormonal medication request): 11/08/16 bi-rads 2 benign Refill authorized: Called and spoke with patient she is cutting the patch In half and feels like that is work for her.

## 2018-06-17 ENCOUNTER — Other Ambulatory Visit: Payer: Self-pay | Admitting: Obstetrics & Gynecology

## 2018-06-19 NOTE — Telephone Encounter (Signed)
Medication refill request: provera Last AEX:  05-06-17 SM  Next AEX: 07-27-18  Last MMG (if hormonal medication request): 11-08-16 BIRADS 2 benign  Refill authorized: 05-23-17 #90, 4RF. Today, please advise.

## 2018-07-21 NOTE — Progress Notes (Signed)
57 y.o. 412P0011 Married White or Caucasian female here for annual exam.  Doing well.  Had a tiny amount of spotting with her progesterone withdrawal.    Thinking about the Shingrix vaccination.    No LMP recorded. (Menstrual status: Perimenopausal).          Sexually active: Yes.    The current method of family planning is post menopausal status.    Exercising: Yes.    Running weight and plates  Smoker:  no  Health Maintenance: Pap:  05/06/17 with neg HR HPV  History of abnormal Pap:  no MMG:  11/18/16 Density C Bi-rads 2 Benign Colonoscopy:  10/17 normal BMD:   Never.  D/w pt guidelines TDaP:  2009 Pneumonia vaccine(s):  no Shingrix:   D/w pt today Hep C testing: donates blood Screening Labs: would like to talk to provider, Hb toda, Urine today: no   reports that she has never smoked. She has never used smokeless tobacco. She reports current alcohol use of about 3.0 standard drinks of alcohol per week. She reports that she does not use drugs.  No past medical history on file.  Past Surgical History:  Procedure Laterality Date  . AUGMENTATION MAMMAPLASTY Bilateral 1986  . BREAST SURGERY  07/09/14   removal and replaced implants  . DILATION AND CURETTAGE OF UTERUS      Current Outpatient Medications  Medication Sig Dispense Refill  . Calcium Carbonate-Vitamin D (CALCIUM + D PO) Take by mouth daily.    Marland Kitchen. estradiol (VIVELLE-DOT) 0.075 MG/24HR APPLY 1 PATCH ONTO THE SKIN TWICE WEEKLY 24 patch 0  . medroxyPROGESTERone (PROVERA) 5 MG tablet TAKE 1 TABLET(5 MG) BY MOUTH DAILY 90 tablet 0   No current facility-administered medications for this visit.     Family History  Problem Relation Age of Onset  . Colon cancer Maternal Uncle 70  . Colon cancer Paternal Grandfather 7570  . Lymphoma Mother 5286    Review of Systems  All other systems reviewed and are negative.   Exam:   Vitals:   07/27/18 0825  BP: 110/64  Pulse: 64  Resp: 14    General appearance: alert,  cooperative and appears stated age Head: Normocephalic, without obvious abnormality, atraumatic Neck: no adenopathy, supple, symmetrical, trachea midline and thyroid normal to inspection and palpation Lungs: clear to auscultation bilaterally Breasts: normal appearance, no masses or tenderness Heart: regular rate and rhythm Abdomen: soft, non-tender; bowel sounds normal; no masses,  no organomegaly Extremities: extremities normal, atraumatic, no cyanosis or edema Skin: Skin color, texture, turgor normal. No rashes or lesions Lymph nodes: Cervical, supraclavicular, and axillary nodes normal. No abnormal inguinal nodes palpated Neurologic: Grossly normal   Pelvic: External genitalia:  no lesions              Urethra:  normal appearing urethra with no masses, tenderness or lesions              Bartholins and Skenes: normal                 Vagina: normal appearing vagina with normal color and discharge, no lesions              Cervix: no lesions              Pap taken: No. Bimanual Exam:  Uterus:  normal size, contour, position, consistency, mobility, non-tender              Adnexa: normal adnexa and no mass, fullness, tenderness  Rectovaginal: Confirms               Anus:  normal sphincter tone, no lesions  Chaperone was present for exam.  A:  Well Woman with normal exam PMP, on HRT H/O breast implant rupture with replacement 2015 Family hx of colon cancer  P:   Mammogram guidelines reviewed. pap smear not indicated, neg pap and neg HR HPV 10/18 CBC, CMP, lipids, TSh, Vit D obtained today Vivelle dot rx not needed.  Considering decreasing dosage again this year.  Just got RF for 0.075, using 1/2 patch daily so will let me know if/when wants new rx Change provera to 2.5mg  daily.  If has irregular bleeding, she will elt me know Colonoscopy is UTD Consider BMD around age 57 Shingrix vaccination discussed Tdap updated today as well AEX 1 year or follow up prn.

## 2018-07-27 ENCOUNTER — Ambulatory Visit (INDEPENDENT_AMBULATORY_CARE_PROVIDER_SITE_OTHER): Payer: 59 | Admitting: Obstetrics & Gynecology

## 2018-07-27 ENCOUNTER — Encounter: Payer: Self-pay | Admitting: Obstetrics & Gynecology

## 2018-07-27 VITALS — BP 110/64 | HR 64 | Resp 14 | Ht 68.0 in | Wt 123.0 lb

## 2018-07-27 DIAGNOSIS — Z23 Encounter for immunization: Secondary | ICD-10-CM

## 2018-07-27 DIAGNOSIS — Z01419 Encounter for gynecological examination (general) (routine) without abnormal findings: Secondary | ICD-10-CM | POA: Diagnosis not present

## 2018-07-27 DIAGNOSIS — R748 Abnormal levels of other serum enzymes: Secondary | ICD-10-CM | POA: Diagnosis not present

## 2018-07-27 DIAGNOSIS — Z0000 Encounter for general adult medical examination without abnormal findings: Secondary | ICD-10-CM

## 2018-07-27 MED ORDER — MEDROXYPROGESTERONE ACETATE 5 MG PO TABS: ORAL_TABLET | ORAL | 4 refills | Status: DC

## 2018-07-27 NOTE — Patient Instructions (Signed)
[facility] Phone: [phone] ([phone] Location: Lower level of [facility] ([address]) Hours: 7:30 a.m. to 6:00 p.m., Monday through Friday.   [facility] Phone: [phone] Location: [address] Hours: 7:30 a.m. to 6:00 p.m., Monday through Friday.  

## 2018-07-31 ENCOUNTER — Telehealth: Payer: Self-pay | Admitting: *Deleted

## 2018-07-31 NOTE — Addendum Note (Signed)
Addended by: Jerene Bears on: 07/31/2018 01:11 PM   Modules accepted: Orders

## 2018-07-31 NOTE — Addendum Note (Signed)
Addended by: Jerene Bears on: 07/31/2018 01:15 PM   Modules accepted: Orders

## 2018-07-31 NOTE — Telephone Encounter (Signed)
-----   Message from Jerene Bears, MD sent at 07/31/2018  1:05 PM EST ----- Please let pt know her TSH, Vit D and CBC were normal.  Cholesterol looked good with total at 202 (just above normal) but her HDLs were really good at 99.  No treatment is needed.  Repeat in about 3 years.  Her CMP showed AST and ALT to be mildly elevated and her creatinine to be 0.01 above normal.  Watch tylenol and alcohol intake and repeat liver function tests in one month.  Order placed.  I will watch her creatinine level yearly but this mild elevated is not worrisome at this time.  Thanks.   Order for lab work has been placed.  Thanks.

## 2018-07-31 NOTE — Telephone Encounter (Signed)
Notes recorded by Leda Min, RN on 07/31/2018 at 3:26 PM EST Left message to call Noreene Larsson, RN at [facility] [phone].

## 2018-08-01 LAB — CBC
Hematocrit: 39.5 % (ref 34.0–46.6)
Hemoglobin: 13.4 g/dL (ref 11.1–15.9)
MCH: 30.2 pg (ref 26.6–33.0)
MCHC: 33.9 g/dL (ref 31.5–35.7)
MCV: 89 fL (ref 79–97)
Platelets: 248 10*3/uL (ref 150–450)
RBC: 4.44 x10E6/uL (ref 3.77–5.28)
RDW: 13 % (ref 12.3–15.4)
WBC: 4.7 10*3/uL (ref 3.4–10.8)

## 2018-08-01 LAB — LIPID PANEL
CHOL/HDL RATIO: 2 ratio (ref 0.0–4.4)
Cholesterol, Total: 202 mg/dL — ABNORMAL HIGH (ref 100–199)
HDL: 99 mg/dL (ref >39)
LDL CALC: 91 mg/dL (ref 0–99)
TRIGLYCERIDES: 60 mg/dL (ref 0–149)
VLDL Cholesterol Cal: 12 mg/dL (ref 5–40)

## 2018-08-01 LAB — COMPREHENSIVE METABOLIC PANEL WITH GFR
ALT: 56 [IU]/L — ABNORMAL HIGH (ref 0–32)
AST: 49 [IU]/L — ABNORMAL HIGH (ref 0–40)
Albumin/Globulin Ratio: 2 (ref 1.2–2.2)
Albumin: 4.7 g/dL (ref 3.5–5.5)
Alkaline Phosphatase: 70 [IU]/L (ref 39–117)
BUN/Creatinine Ratio: 17 (ref 9–23)
BUN: 17 mg/dL (ref 6–24)
Bilirubin Total: <0.2 mg/dL (ref 0.0–1.2)
CO2: 21 mmol/L (ref 20–29)
Calcium: 9.4 mg/dL (ref 8.7–10.2)
Chloride: 100 mmol/L (ref 96–106)
Creatinine, Ser: 1.01 mg/dL — ABNORMAL HIGH (ref 0.57–1.00)
GFR calc Af Amer: 72 mL/min/{1.73_m2}
GFR calc non Af Amer: 62 mL/min/{1.73_m2}
Globulin, Total: 2.3 g/dL (ref 1.5–4.5)
Glucose: 96 mg/dL (ref 65–99)
Potassium: 3.9 mmol/L (ref 3.5–5.2)
Sodium: 140 mmol/L (ref 134–144)
Total Protein: 7 g/dL (ref 6.0–8.5)

## 2018-08-01 LAB — TSH: TSH: 2.31 u[IU]/mL (ref 0.450–4.500)

## 2018-08-01 LAB — VITAMIN D 25 HYDROXY (VIT D DEFICIENCY, FRACTURES): Vit D, 25-Hydroxy: 12.1 ng/mL — ABNORMAL LOW (ref 30.0–100.0)

## 2018-08-01 NOTE — Telephone Encounter (Signed)
Spoke with patient, advised as seen below per Dr. Hyacinth Meeker. Lab appt scheduled for 09/01/18 at 9:45am. Patient verbalizes understanding and is agreeable.    Encounter closed.

## 2018-08-01 NOTE — Telephone Encounter (Signed)
Patient is returning a call to [name]. °

## 2018-09-01 ENCOUNTER — Other Ambulatory Visit (INDEPENDENT_AMBULATORY_CARE_PROVIDER_SITE_OTHER): Payer: 59

## 2018-09-01 DIAGNOSIS — R748 Abnormal levels of other serum enzymes: Secondary | ICD-10-CM

## 2018-09-02 LAB — HEPATIC FUNCTION PANEL
ALT: 32 IU/L (ref 0–32)
AST: 32 IU/L (ref 0–40)
Albumin: 4.6 g/dL (ref 3.8–4.9)
Alkaline Phosphatase: 57 IU/L (ref 39–117)
BILIRUBIN TOTAL: 0.5 mg/dL (ref 0.0–1.2)
BILIRUBIN, DIRECT: 0.16 mg/dL (ref 0.00–0.40)
Total Protein: 7.1 g/dL (ref 6.0–8.5)

## 2018-09-15 ENCOUNTER — Other Ambulatory Visit: Payer: Self-pay | Admitting: Obstetrics & Gynecology

## 2018-09-15 NOTE — Telephone Encounter (Signed)
Erroneous encounter

## 2018-10-18 ENCOUNTER — Other Ambulatory Visit: Payer: Self-pay | Admitting: Obstetrics & Gynecology

## 2018-10-18 NOTE — Telephone Encounter (Signed)
Medication refill request: provera 5mg   Last AEX:  07/27/18 SM Next AEX: 11/23/19 SM Last MMG (if hormonal medication request): 11/18/16 [place] right  BIRADS2:benign  Refill authorized: 07/27/18 #90tabs/4R [facility]

## 2019-01-04 ENCOUNTER — Other Ambulatory Visit: Payer: Self-pay | Admitting: Obstetrics & Gynecology

## 2019-01-04 NOTE — Telephone Encounter (Signed)
Medication refill request: vivelle dot  Last AEX:  07/27/18 Next AEX: 11/23/19 Last MMG (if hormonal medication request): 11/18/16 birads  2 beige  Refill authorized: #24 and 1 rf

## 2019-08-29 ENCOUNTER — Other Ambulatory Visit: Payer: Self-pay | Admitting: Obstetrics & Gynecology

## 2019-08-29 DIAGNOSIS — Z01419 Encounter for gynecological examination (general) (routine) without abnormal findings: Secondary | ICD-10-CM

## 2019-08-29 DIAGNOSIS — Z79890 Hormone replacement therapy (postmenopausal): Secondary | ICD-10-CM

## 2019-08-29 NOTE — Telephone Encounter (Signed)
Med refill request: Vivelle Dot Last AEX: 07/27/2018 Next AEX: 11/23/2019 Last MMG (if hormonal med)  2018, needs updated MMG, left message for pt to call with update.  Refill authorized: #24, 1 RF. Pended if approved.

## 2019-09-11 ENCOUNTER — Other Ambulatory Visit: Payer: Self-pay | Admitting: Obstetrics & Gynecology

## 2019-09-11 NOTE — Telephone Encounter (Signed)
Medication refill request: Provera Last AEX:  07/27/18 SM Next AEX: 11/23/19 Last MMG (if hormonal medication request): 11/18/16 Density C Bi-rads 2 Benign Refill authorized: Please advise on refill

## 2019-09-12 NOTE — Telephone Encounter (Signed)
Patient notified prescription sent to pharmacy. Patient verbalized understanding and appreciative of phone call.   Encounter closed.

## 2019-09-12 NOTE — Telephone Encounter (Signed)
Call to patient. RN advised patient request had been sent to Dr. Hyacinth Meeker for review and approval. Patient verbalized understanding and agreeable.

## 2019-09-12 NOTE — Telephone Encounter (Signed)
Patient calling in regards to Provera. States she is currently in [place] without her prescription. [facility] in [place].

## 2019-09-12 NOTE — Telephone Encounter (Signed)
Rx has been completed and it was done to the [facility] in [place].

## 2019-11-19 NOTE — Progress Notes (Signed)
59 y.o. G11P0011 Married White or Caucasian female here for annual exam.  Doing well.  Has been vaccinated.  Family is doing well.    Denies vaginal bleeding.  Patient's last menstrual period was 07/26/2009.          Sexually active: Yes.    The current method of family planning is post menopausal status.    Exercising: Yes.    running, elliptical & weights Smoker:  no  Health Maintenance: Pap:  05-06-17 neg HPV HR neg History of abnormal Pap:  no MMG:  4/18 bilateral & rt breast u/s birads 2:neg Colonoscopy:  10/17 normal, 7 year follow up BMD:   none TDaP:  06/2019 Pneumonia vaccine(s):  Not done Shingrix:   D/w pt today Hep C testing: donated blood in past Screening Labs: done last year, declines today   reports that she has never smoked. She has never used smokeless tobacco. She reports current alcohol use of about 3.0 standard drinks of alcohol per week. She reports that she does not use drugs.  Past Medical History:  Diagnosis Date  . Hormone replacement therapy (HRT)     Past Surgical History:  Procedure Laterality Date  . AUGMENTATION MAMMAPLASTY Bilateral 1986  . BREAST SURGERY  07/09/14   removal and replaced implants  . DILATION AND CURETTAGE OF UTERUS      Current Outpatient Medications  Medication Sig Dispense Refill  . estradiol (VIVELLE-DOT) 0.075 MG/24HR APPLY 1 PATCH ONTO THE SKIN TWICE WEEKLY 24 patch 1  . medroxyPROGESTERone (PROVERA) 5 MG tablet TAKE 1/2 TABLET BY MOUTH DAILY 90 tablet 0   No current facility-administered medications for this visit.    Family History  Problem Relation Age of Onset  . Colon cancer Maternal Uncle 70  . Colon cancer Paternal Grandfather 63  . Lymphoma Mother 9    Review of Systems  Constitutional: Negative.   HENT: Negative.   Eyes: Negative.   Respiratory: Negative.   Cardiovascular: Negative.   Gastrointestinal: Negative.   Endocrine: Negative.   Genitourinary: Negative.   Musculoskeletal: Negative.    Skin: Negative.   Allergic/Immunologic: Negative.   Neurological: Negative.   Psychiatric/Behavioral: Negative.     Exam:   BP 102/62   Pulse 68   Temp 97.9 F (36.6 C) (Skin)   Resp 16   Ht 5' 8.45" (1.739 m)   Wt 119 lb (54 kg)   LMP 07/26/2009   BMI 17.86 kg/m   Height: 5' 8.45" (173.9 cm)  General appearance: alert, cooperative and appears stated age Head: Normocephalic, without obvious abnormality, atraumatic Neck: no adenopathy, supple, symmetrical, trachea midline and thyroid normal to inspection and palpation Lungs: clear to auscultation bilaterally Breasts: normal appearance, no masses or tenderness Heart: regular rate and rhythm Abdomen: soft, non-tender; bowel sounds normal; no masses,  no organomegaly Extremities: extremities normal, atraumatic, no cyanosis or edema Skin: Skin color, texture, turgor normal. No rashes or lesions Lymph nodes: Cervical, supraclavicular, and axillary nodes normal. No abnormal inguinal nodes palpated Neurologic: Grossly normal   Pelvic: External genitalia:  no lesions              Urethra:  normal appearing urethra with no masses, tenderness or lesions              Bartholins and Skenes: normal                 Vagina: normal appearing vagina with normal color and discharge, no lesions  Cervix: no lesions              Pap taken: No. Bimanual Exam:  Uterus:  normal size, contour, position, consistency, mobility, non-tender              Adnexa: normal adnexa and no mass, fullness, tenderness               Rectovaginal: Confirms               Anus:  normal sphincter tone, no lesions  Chaperone, Terence Lux, CMA, was present for exam.  A:  Well Woman with normal exam PMP, no HRT H/o breast implant rupture with replacement 2015 Family hx of colon cancer  P:   Mammogram yearly recommended.  She  pap smear neg with neg HR HPV 10/18.  2020 ASCCP guidelines reviewed.  Will plan to repeat 5 years from 2018. RF vivelle  dot 0.075mg  twice weekly and Provera 5mg  1/2 tab daily.  #90/0RF Last work done last year.  Declines today. Shingrix vaccinations discussed.  Plans to do it this yea.r Return annually or prn

## 2019-11-22 ENCOUNTER — Other Ambulatory Visit: Payer: Self-pay

## 2019-11-23 ENCOUNTER — Encounter: Payer: Self-pay | Admitting: Obstetrics & Gynecology

## 2019-11-23 ENCOUNTER — Other Ambulatory Visit: Payer: Self-pay

## 2019-11-23 ENCOUNTER — Ambulatory Visit (INDEPENDENT_AMBULATORY_CARE_PROVIDER_SITE_OTHER): Payer: No Typology Code available for payment source | Admitting: Obstetrics & Gynecology

## 2019-11-23 VITALS — BP 102/62 | HR 68 | Temp 97.9°F | Resp 16 | Ht 68.45 in | Wt 119.0 lb

## 2019-11-23 DIAGNOSIS — Z01419 Encounter for gynecological examination (general) (routine) without abnormal findings: Secondary | ICD-10-CM

## 2019-11-23 DIAGNOSIS — Z79890 Hormone replacement therapy (postmenopausal): Secondary | ICD-10-CM

## 2019-11-23 MED ORDER — ESTRADIOL 0.075 MG/24HR TD PTTW: 1.0000 | MEDICATED_PATCH | TRANSDERMAL | 0 refills | Status: DC

## 2019-11-23 MED ORDER — MEDROXYPROGESTERONE ACETATE 5 MG PO TABS: ORAL_TABLET | ORAL | 0 refills | Status: DC

## 2020-02-12 ENCOUNTER — Other Ambulatory Visit: Payer: Self-pay | Admitting: Obstetrics & Gynecology

## 2020-02-12 DIAGNOSIS — Z01419 Encounter for gynecological examination (general) (routine) without abnormal findings: Secondary | ICD-10-CM

## 2020-02-12 DIAGNOSIS — Z79890 Hormone replacement therapy (postmenopausal): Secondary | ICD-10-CM

## 2020-02-12 NOTE — Telephone Encounter (Signed)
Medication refill request: Vivelle-Dot Last AEX:  11/23/19 SM Next AEX: 02/19/21 Last MMG (if hormonal medication request): 4/18 bilateral & rt breast u/s birads 2:neg Refill authorized: Today, please advise

## 2020-03-11 ENCOUNTER — Other Ambulatory Visit: Payer: Self-pay | Admitting: Obstetrics & Gynecology

## 2020-03-11 DIAGNOSIS — Z79890 Hormone replacement therapy (postmenopausal): Secondary | ICD-10-CM

## 2020-03-11 NOTE — Telephone Encounter (Signed)
Medication refill request: Medroxyprogesterone 5mg   Last AEX:  11/23/19 Next AEX: 02/19/21 Last MMG (if hormonal medication request): 11/18/16 No abnormality  Refill authorized: 90/0

## 2020-12-09 ENCOUNTER — Ambulatory Visit (HOSPITAL_BASED_OUTPATIENT_CLINIC_OR_DEPARTMENT_OTHER): Payer: 59 | Admitting: Obstetrics & Gynecology

## 2020-12-10 ENCOUNTER — Other Ambulatory Visit: Payer: Self-pay

## 2020-12-10 ENCOUNTER — Encounter (HOSPITAL_BASED_OUTPATIENT_CLINIC_OR_DEPARTMENT_OTHER): Payer: Self-pay | Admitting: Obstetrics & Gynecology

## 2020-12-10 ENCOUNTER — Ambulatory Visit (INDEPENDENT_AMBULATORY_CARE_PROVIDER_SITE_OTHER): Payer: Self-pay | Admitting: Obstetrics & Gynecology

## 2020-12-10 ENCOUNTER — Other Ambulatory Visit (HOSPITAL_COMMUNITY)
Admission: RE | Admit: 2020-12-10 | Discharge: 2020-12-10 | Disposition: A | Discharge status: Home / Self Care | Payer: No Typology Code available for payment source | Source: Ambulatory Visit | Attending: Obstetrics & Gynecology | Admitting: Obstetrics & Gynecology

## 2020-12-10 VITALS — BP 102/72 | HR 73 | Ht 67.0 in | Wt 121.8 lb

## 2020-12-10 DIAGNOSIS — Z124 Encounter for screening for malignant neoplasm of cervix: Secondary | ICD-10-CM | POA: Insufficient documentation

## 2020-12-10 DIAGNOSIS — Z01419 Encounter for gynecological examination (general) (routine) without abnormal findings: Secondary | ICD-10-CM

## 2020-12-10 DIAGNOSIS — R748 Abnormal levels of other serum enzymes: Secondary | ICD-10-CM

## 2020-12-10 DIAGNOSIS — Z79890 Hormone replacement therapy (postmenopausal): Secondary | ICD-10-CM

## 2020-12-10 MED ORDER — MEDROXYPROGESTERONE ACETATE 5 MG PO TABS: 5.0000 mg | ORAL_TABLET | Freq: Every day | ORAL | 0 refills | Status: DC

## 2020-12-10 MED ORDER — ESTRADIOL 0.05 MG/24HR TD PTTW: 1.0000 | MEDICATED_PATCH | TRANSDERMAL | 0 refills | Status: DC

## 2020-12-10 NOTE — Progress Notes (Signed)
60 y.o. G64P0011 Married White or Caucasian female here for annual exam.  Denies vaginal bleeding.  Had shingles.  Did not end up taking anything.  Has not done mammogram.  On HRT.  Must do it this year.  Will decrease HRT dosage this year.  Reviewed with pt.  Patient's last menstrual period was 07/26/2009.          Sexually active: Yes.    The current method of family planning is post menopausal status.    Exercising: Yes.    Smoker:  no  Health Maintenance: Pap:  today History of abnormal Pap:  no MMG:  Pt is aware this is due Colonoscopy:  2017, follow up 7 years BMD:   none TDaP:  06/2019 Pneumonia vaccine(s):  Not indicated Shingrix:   discussed Hep C testing: has donated blood Screening Labs: plan next year   reports that she has never smoked. She has never used smokeless tobacco. She reports current alcohol use of about 3.0 standard drinks of alcohol per week. She reports that she does not use drugs.  Past Medical History:  Diagnosis Date  . Hormone replacement therapy (HRT)     Past Surgical History:  Procedure Laterality Date  . AUGMENTATION MAMMAPLASTY Bilateral 1986  . BREAST SURGERY  07/09/14   removal and replaced implants  . DILATION AND CURETTAGE OF UTERUS      Current Outpatient Medications  Medication Sig Dispense Refill  . [START ON 12/11/2020] estradiol (VIVELLE-DOT) 0.05 MG/24HR patch Place 1 patch (0.05 mg total) onto the skin 2 (two) times a week. 24 patch 0  . medroxyPROGESTERone (PROVERA) 5 MG tablet Take 1 tablet (5 mg total) by mouth daily. 90 tablet 0   No current facility-administered medications for this visit.    Family History  Problem Relation Age of Onset  . Colon cancer Maternal Uncle 70  . Colon cancer Paternal Grandfather 72  . Lymphoma Mother 33    Review of Systems  All other systems reviewed and are negative.   Exam:   BP 102/72 (BP Location: Right Arm, Patient Position: Sitting, Cuff Size: Small)   Pulse 73   Ht 5\' 7"   (1.702 m)   Wt 121 lb 12.8 oz (55.2 kg)   LMP 07/26/2009   BMI 19.08 kg/m   Height: 5\' 7"  (170.2 cm)  General appearance: alert, cooperative and appears stated age Head: Normocephalic, without obvious abnormality, atraumatic Neck: no adenopathy, supple, symmetrical, trachea midline and thyroid normal to inspection and palpation Lungs: clear to auscultation bilaterally Breasts: normal appearance, no masses or tenderness, implants present bilaterally Heart: regular rate and rhythm Abdomen: soft, non-tender; bowel sounds normal; no masses,  no organomegaly Extremities: extremities normal, atraumatic, no cyanosis or edema Skin: Skin color, texture, turgor normal. No rashes or lesions Lymph nodes: Cervical, supraclavicular, and axillary nodes normal. No abnormal inguinal nodes palpated Neurologic: Grossly normal   Pelvic: External genitalia:  no lesions              Urethra:  normal appearing urethra with no masses, tenderness or lesions              Bartholins and Skenes: normal                 Vagina: normal appearing vagina with normal color and no discharge, no lesions              Cervix: no lesions              Pap  taken: Yes.   Bimanual Exam:  Uterus:  normal size, contour, position, consistency, mobility, non-tender              Adnexa: normal adnexa and no mass, fullness, tenderness               Rectovaginal: Confirms               Anus:  normal sphincter tone, no lesions  Chaperone, Ina Homes, CMA, was present for exam.  Assessment/Plan: 1. Well woman exam with routine gynecological exam - pap with HR HPV obtained today - MMG is very overdue.  Must do this year. - colonoscopy 2017, follow up 7 years - plan BMD around age 69 - vaccines updated - plan lab work next year  2. Postmenopausal HRT (hormone replacement therapy) - estradiol 0.05mg  patches to skin twice weekly  #24/0RF - medroxyPROGESTERone (PROVERA) 5 MG tablet; Take 1 tablet (5 mg total) by mouth daily.   Dispense: 90 tablet; Refill: 0

## 2020-12-11 ENCOUNTER — Encounter (HOSPITAL_BASED_OUTPATIENT_CLINIC_OR_DEPARTMENT_OTHER): Payer: Self-pay | Admitting: Obstetrics & Gynecology

## 2020-12-11 DIAGNOSIS — R748 Abnormal levels of other serum enzymes: Secondary | ICD-10-CM | POA: Insufficient documentation

## 2020-12-12 LAB — CYTOLOGY - PAP
Comment: NEGATIVE
Diagnosis: NEGATIVE
High risk HPV: NEGATIVE

## 2020-12-30 ENCOUNTER — Encounter (HOSPITAL_BASED_OUTPATIENT_CLINIC_OR_DEPARTMENT_OTHER): Payer: Self-pay | Admitting: Obstetrics & Gynecology

## 2021-02-19 ENCOUNTER — Ambulatory Visit: Payer: Self-pay

## 2021-04-06 ENCOUNTER — Other Ambulatory Visit (HOSPITAL_BASED_OUTPATIENT_CLINIC_OR_DEPARTMENT_OTHER): Payer: Self-pay | Admitting: *Deleted

## 2021-04-06 MED ORDER — ESTRADIOL 0.05 MG/24HR TD PTTW
1.0000 | MEDICATED_PATCH | TRANSDERMAL | 0 refills | Status: DC
Start: 2021-04-06 — End: 2021-12-24

## 2021-04-06 MED ORDER — ESTRADIOL 0.05 MG/24HR TD PTTW
1.0000 | MEDICATED_PATCH | TRANSDERMAL | 0 refills | Status: DC
Start: 2021-04-06 — End: 2021-04-06

## 2021-07-08 ENCOUNTER — Other Ambulatory Visit (HOSPITAL_BASED_OUTPATIENT_CLINIC_OR_DEPARTMENT_OTHER): Payer: Self-pay | Admitting: Obstetrics & Gynecology

## 2021-07-08 DIAGNOSIS — Z79890 Hormone replacement therapy (postmenopausal): Secondary | ICD-10-CM

## 2021-07-09 ENCOUNTER — Other Ambulatory Visit (HOSPITAL_BASED_OUTPATIENT_CLINIC_OR_DEPARTMENT_OTHER): Payer: Self-pay | Admitting: *Deleted

## 2021-07-09 DIAGNOSIS — Z79890 Hormone replacement therapy (postmenopausal): Secondary | ICD-10-CM

## 2021-07-09 MED ORDER — MEDROXYPROGESTERONE ACETATE 5 MG PO TABS: 5.0000 mg | ORAL_TABLET | Freq: Every day | ORAL | 1 refills | Status: DC

## 2021-12-24 ENCOUNTER — Ambulatory Visit (INDEPENDENT_AMBULATORY_CARE_PROVIDER_SITE_OTHER): Payer: No Typology Code available for payment source | Admitting: Obstetrics & Gynecology

## 2021-12-24 ENCOUNTER — Encounter (HOSPITAL_BASED_OUTPATIENT_CLINIC_OR_DEPARTMENT_OTHER): Payer: Self-pay | Admitting: Obstetrics & Gynecology

## 2021-12-24 VITALS — BP 124/84 | HR 70 | Ht 67.0 in | Wt 119.4 lb

## 2021-12-24 DIAGNOSIS — Z9882 Breast implant status: Secondary | ICD-10-CM

## 2021-12-24 DIAGNOSIS — Z800 Family history of malignant neoplasm of digestive organs: Secondary | ICD-10-CM | POA: Diagnosis not present

## 2021-12-24 DIAGNOSIS — Z79890 Hormone replacement therapy (postmenopausal): Secondary | ICD-10-CM | POA: Diagnosis not present

## 2021-12-24 DIAGNOSIS — Z01419 Encounter for gynecological examination (general) (routine) without abnormal findings: Secondary | ICD-10-CM | POA: Diagnosis not present

## 2021-12-24 MED ORDER — ESTRADIOL 0.05 MG/24HR TD PTTW: 1.0000 | MEDICATED_PATCH | TRANSDERMAL | 3 refills | Status: AC

## 2021-12-24 MED ORDER — MEDROXYPROGESTERONE ACETATE 5 MG PO TABS: 5.0000 mg | ORAL_TABLET | Freq: Every day | ORAL | 3 refills | Status: AC

## 2021-12-24 NOTE — Progress Notes (Signed)
61 y.o. G23P0011 Married White or Caucasian female here for annual exam.  Doing well.  Denies vaginal bleeding.  Moving to [place] next week.  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    Patient's last menstrual period was 07/26/2009.          Sexually active: No.  The current method of family planning is post menopausal status.    Exercising: Yes.     Smoker:  no  Health Maintenance: Pap:  12/10/2020 Negative History of abnormal Pap:  no MMG:  12/30/2020 Negative Colonoscopy:  2017, 7 year follow up BMD:   age 47 Screening Labs: declined   reports that she has never smoked. She has never used smokeless tobacco. She reports current alcohol use of about 3.0 standard drinks per week. She reports that she does not use drugs.  Past Medical History:  Diagnosis Date   Hormone replacement therapy (HRT)     Past Surgical History:  Procedure Laterality Date   AUGMENTATION MAMMAPLASTY Bilateral 1986   BREAST SURGERY  07/09/14   removal and replaced implants   DILATION AND CURETTAGE OF UTERUS      Current Outpatient Medications  Medication Sig Dispense Refill   estradiol (VIVELLE-DOT) 0.05 MG/24HR patch Place 1 patch (0.05 mg total) onto the skin 2 (two) times a week. 24 patch 3   medroxyPROGESTERone (PROVERA) 5 MG tablet Take 1 tablet (5 mg total) by mouth daily. 90 tablet 3   No current facility-administered medications for this visit.    Family History  Problem Relation Age of Onset   Colon cancer Maternal Uncle 59   Colon cancer Paternal Grandfather 69   Lymphoma Mother 57   ROS: Genitourinary:negative  Exam:   BP 124/84 (BP Location: Right Arm, Patient Position: Sitting, Cuff Size: Normal)   Pulse 70   Ht 5\' 7"  (1.702 m)   Wt 119 lb 6.4 oz (54.2 kg)   LMP 07/26/2009   BMI 18.70 kg/m   Height: 5\' 7"  (170.2 cm)  General appearance: alert, cooperative and appears stated age Head: Normocephalic, without obvious abnormality, atraumatic Neck: no adenopathy, supple, symmetrical,  trachea midline and thyroid normal to inspection and palpation Lungs: clear to auscultation bilaterally Breasts: normal appearance, no masses or tenderness Heart: regular rate and rhythm Abdomen: soft, non-tender; bowel sounds normal; no masses,  no organomegaly Extremities: extremities normal, atraumatic, no cyanosis or edema Skin: Skin color, texture, turgor normal. No rashes or lesions Lymph nodes: Cervical, supraclavicular, and axillary nodes normal. No abnormal inguinal nodes palpated Neurologic: Grossly normal   Pelvic: External genitalia:  no lesions              Urethra:  normal appearing urethra with no masses, tenderness or lesions              Bartholins and Skenes: normal                 Vagina: normal appearing vagina with normal color and no discharge, no lesions              Cervix: no lesions              Pap taken: No. Bimanual Exam:  Uterus:  normal size, contour, position, consistency, mobility, non-tender              Adnexa: normal adnexa and no mass, fullness, tenderness               Rectovaginal: Confirms  Anus:  normal sphincter tone, no lesions  Chaperone, Lise Auer, CMA, was present for exam.  Assessment/Plan: 1. Well woman exam with routine gynecological exam - pap and neg HR HPV 2022.  Not indicated today. - MMG 12/2020.  Pt will plan to do this once moves to [facility] - colonoscopy 2017, follow up 7 years - plan BMD at age 89 - vaccines reviewed - lab work will be planned with pt once she moves and establishes care  2. Postmenopausal HRT (hormone replacement therapy) - Estradiol 0.05mg  patches, 1/2 to skin twice weekly.  #24/3RF - medroxyPROGESTERone (PROVERA) 5 MG tablet; Take 1 tablet (5 mg total) by mouth daily.  Dispense: 90 tablet; Refill: 3  3. Breast implant status - h/o rupture with replacement 2015  5. Family history of colon cancer

## 2021-12-28 ENCOUNTER — Ambulatory Visit (HOSPITAL_BASED_OUTPATIENT_CLINIC_OR_DEPARTMENT_OTHER): Payer: No Typology Code available for payment source | Admitting: Obstetrics & Gynecology
